# Patient Record
Sex: Female | Born: 1960 | Race: White | Hispanic: No | Smoking: Current every day smoker
Health system: Southern US, Community
[De-identification: ages and names within clinical notes are randomized; demographics above are authoritative.]

## PROBLEM LIST (undated history)

## (undated) DIAGNOSIS — F329 Major depressive disorder, single episode, unspecified: Secondary | ICD-10-CM

## (undated) DIAGNOSIS — F32A Depression, unspecified: Secondary | ICD-10-CM

## (undated) DIAGNOSIS — M797 Fibromyalgia: Secondary | ICD-10-CM

## (undated) DIAGNOSIS — R569 Unspecified convulsions: Secondary | ICD-10-CM

## (undated) DIAGNOSIS — F4310 Post-traumatic stress disorder, unspecified: Secondary | ICD-10-CM

## (undated) DIAGNOSIS — J449 Chronic obstructive pulmonary disease, unspecified: Secondary | ICD-10-CM

## (undated) DIAGNOSIS — F909 Attention-deficit hyperactivity disorder, unspecified type: Secondary | ICD-10-CM

## (undated) DIAGNOSIS — G4700 Insomnia, unspecified: Secondary | ICD-10-CM

## (undated) HISTORY — PX: ABDOMINAL HYSTERECTOMY: SHX81

## (undated) HISTORY — DX: Chronic obstructive pulmonary disease, unspecified: J44.9

---

## 2012-10-28 ENCOUNTER — Encounter (HOSPITAL_COMMUNITY): Payer: Self-pay | Admitting: *Deleted

## 2012-10-28 ENCOUNTER — Emergency Department (HOSPITAL_COMMUNITY)
Admission: EM | Admit: 2012-10-28 | Discharge: 2012-10-28 | Disposition: A | Discharge status: Home / Self Care | Payer: Self-pay | Attending: Emergency Medicine | Admitting: Emergency Medicine

## 2012-10-28 DIAGNOSIS — F17200 Nicotine dependence, unspecified, uncomplicated: Secondary | ICD-10-CM | POA: Insufficient documentation

## 2012-10-28 DIAGNOSIS — Z79899 Other long term (current) drug therapy: Secondary | ICD-10-CM | POA: Insufficient documentation

## 2012-10-28 DIAGNOSIS — F3289 Other specified depressive episodes: Secondary | ICD-10-CM | POA: Insufficient documentation

## 2012-10-28 DIAGNOSIS — F329 Major depressive disorder, single episode, unspecified: Secondary | ICD-10-CM | POA: Insufficient documentation

## 2012-10-28 DIAGNOSIS — F1910 Other psychoactive substance abuse, uncomplicated: Secondary | ICD-10-CM | POA: Insufficient documentation

## 2012-10-28 HISTORY — DX: Depression, unspecified: F32.A

## 2012-10-28 HISTORY — DX: Major depressive disorder, single episode, unspecified: F32.9

## 2012-10-28 LAB — RAPID URINE DRUG SCREEN, HOSP PERFORMED
Cocaine: NOT DETECTED
Opiates: POSITIVE — AB

## 2012-10-28 LAB — CBC WITH DIFFERENTIAL/PLATELET
Basophils Absolute: 0.1 10*3/uL (ref 0.0–0.1)
Basophils Relative: 1 % (ref 0–1)
Hemoglobin: 13 g/dL (ref 12.0–15.0)
MCHC: 33 g/dL (ref 30.0–36.0)
Monocytes Relative: 6 % (ref 3–12)
Neutro Abs: 5.8 10*3/uL (ref 1.7–7.7)
Neutrophils Relative %: 59 % (ref 43–77)
Platelets: 360 10*3/uL (ref 150–400)
RDW: 15.2 % (ref 11.5–15.5)

## 2012-10-28 LAB — ETHANOL: Alcohol, Ethyl (B): <11 mg/dL (ref 0–11)

## 2012-10-28 LAB — BASIC METABOLIC PANEL
BUN: 16 mg/dL (ref 6–23)
GFR calc Af Amer: >90 mL/min (ref >90)
GFR calc non Af Amer: 85 mL/min — ABNORMAL LOW (ref >90)
Potassium: 3.5 mEq/L (ref 3.5–5.1)

## 2012-10-28 NOTE — ED Notes (Signed)
Pt says depressed,  Health dept advised to come to ER due t o  SI.   Vomiting, yesterday, and cried all day.

## 2012-10-28 NOTE — ED Provider Notes (Signed)
History     CSN: [id number]  Arrival date & time 10/28/12  1731   First MD Initiated Contact with Patient 10/28/12 1824      Chief Complaint  Patient presents with  . V70.1    (Consider location/radiation/quality/duration/timing/severity/associated sxs/prior treatment) HPI.... patient has run out of her Ritalin and wants me to refill it.  No suicidal or homicidal ideation. Patient has history of depression. Nothing makes symptoms better or worse. Severity is mild to moderate. She has been crying today.  Past Medical History  Diagnosis Date  . Depression     Past Surgical History  Procedure Laterality Date  . Abdominal hysterectomy      History reviewed. No pertinent family history.  History  Substance Use Topics  . Smoking status: Current Every Day Smoker  . Smokeless tobacco: Not on file  . Alcohol Use: No    OB History   Grav Para Term Preterm Abortions TAB SAB Ect Mult Living                  Review of Systems  All other systems reviewed and are negative.    Allergies  Sulfa antibiotics  Home Medications   Current Outpatient Rx  Name  Route  Sig  Dispense  Refill  . diazepam (VALIUM) 10 MG tablet   Oral   Take 10 mg by mouth 3 (three) times daily.         Marland Kitchen FLUoxetine (PROZAC) 40 MG capsule   Oral   Take 40 mg by mouth daily.         . methylphenidate (RITALIN) 10 MG tablet   Oral   Take 20-30 mg by mouth 3 (three) times daily. Patient takes 3 tablets in the morning and 2 tablets at noon and 2 tablets at 5pm           BP 106/57  Pulse 85  Temp(Src) 98.5 F (36.9 C) (Oral)  Resp 20  Ht 5\' 1"  (1.549 m)  Wt 110 lb (49.896 kg)  BMI 20.8 kg/m2  SpO2 98%  Physical Exam  Nursing note and vitals reviewed. Constitutional: She is oriented to person, place, and time. She appears well-developed and well-nourished.  HENT:  Head: Normocephalic and atraumatic.  Eyes: Conjunctivae and EOM are normal. Pupils are equal, round, and reactive to  light.  Neck: Normal range of motion. Neck supple.  Cardiovascular: Normal rate, regular rhythm and normal heart sounds.   Pulmonary/Chest: Effort normal and breath sounds normal.  Abdominal: Soft. Bowel sounds are normal.  Musculoskeletal: Normal range of motion.  Neurological: She is alert and oriented to person, place, and time.  Skin: Skin is warm and dry.  Psychiatric:  Flight of ideas, but not psychotic    ED Course  Procedures (including critical care time)  Labs Reviewed  BASIC METABOLIC PANEL - Abnormal; Notable for the following:    GFR calc non Af Amer 85 (*)    All other components within normal limits  URINE RAPID DRUG SCREEN (HOSP PERFORMED) - Abnormal; Notable for the following:    Opiates POSITIVE (*)    Benzodiazepines POSITIVE (*)    Amphetamines POSITIVE (*)    Tetrahydrocannabinol POSITIVE (*)    All other components within normal limits  CBC WITH DIFFERENTIAL  ETHANOL   No results found.   1. Depression       MDM  Patient is not psychotic. No suicidal or homicidal ideation. Drug screen positive for opiates, benzos, amphetamines, THC.   Referral  to mental Health Center.        Donnetta Hutching, MD 10/28/12 2028

## 2012-10-28 NOTE — ED Notes (Signed)
Pt requesting to leave. EDP aware and discharge orders placed.

## 2012-10-28 NOTE — ED Notes (Signed)
Pt given personal belongings and jewelry by security

## 2012-10-28 NOTE — ED Notes (Signed)
Pt states she has been off of her medication for a while because she cannot afford them. States she did start taking Prozac yesterday. States she has lost her home and has family issues. States she does not have insurance so she can't find a doctor. Stated she was told by [facility] they could not help her.

## 2014-03-13 ENCOUNTER — Other Ambulatory Visit (HOSPITAL_COMMUNITY): Payer: Self-pay | Admitting: Physician Assistant

## 2014-03-13 DIAGNOSIS — Z1231 Encounter for screening mammogram for malignant neoplasm of breast: Secondary | ICD-10-CM

## 2014-03-16 ENCOUNTER — Ambulatory Visit (HOSPITAL_COMMUNITY): Admission: RE | Admit: 2014-03-16 | Payer: Self-pay | Source: Ambulatory Visit

## 2014-03-23 ENCOUNTER — Ambulatory Visit (HOSPITAL_COMMUNITY)
Admission: RE | Admit: 2014-03-23 | Discharge: 2014-03-23 | Disposition: A | Discharge status: Home / Self Care | Payer: Self-pay | Source: Ambulatory Visit | Attending: Physician Assistant | Admitting: Physician Assistant

## 2014-03-23 DIAGNOSIS — Z1231 Encounter for screening mammogram for malignant neoplasm of breast: Secondary | ICD-10-CM

## 2014-10-30 ENCOUNTER — Encounter (HOSPITAL_COMMUNITY): Payer: Self-pay | Admitting: Emergency Medicine

## 2014-10-30 ENCOUNTER — Emergency Department (HOSPITAL_COMMUNITY)
Admission: EM | Admit: 2014-10-30 | Discharge: 2014-10-30 | Disposition: A | Discharge status: Home / Self Care | Payer: Self-pay | Attending: Emergency Medicine | Admitting: Emergency Medicine

## 2014-10-30 ENCOUNTER — Emergency Department (HOSPITAL_COMMUNITY): Payer: Self-pay

## 2014-10-30 DIAGNOSIS — Z8739 Personal history of other diseases of the musculoskeletal system and connective tissue: Secondary | ICD-10-CM | POA: Insufficient documentation

## 2014-10-30 DIAGNOSIS — Z720 Tobacco use: Secondary | ICD-10-CM | POA: Insufficient documentation

## 2014-10-30 DIAGNOSIS — Z7982 Long term (current) use of aspirin: Secondary | ICD-10-CM | POA: Insufficient documentation

## 2014-10-30 DIAGNOSIS — F4310 Post-traumatic stress disorder, unspecified: Secondary | ICD-10-CM | POA: Insufficient documentation

## 2014-10-30 DIAGNOSIS — R519 Headache, unspecified: Secondary | ICD-10-CM

## 2014-10-30 DIAGNOSIS — R51 Headache: Secondary | ICD-10-CM | POA: Insufficient documentation

## 2014-10-30 DIAGNOSIS — F329 Major depressive disorder, single episode, unspecified: Secondary | ICD-10-CM | POA: Insufficient documentation

## 2014-10-30 DIAGNOSIS — F419 Anxiety disorder, unspecified: Secondary | ICD-10-CM | POA: Insufficient documentation

## 2014-10-30 DIAGNOSIS — Z8669 Personal history of other diseases of the nervous system and sense organs: Secondary | ICD-10-CM | POA: Insufficient documentation

## 2014-10-30 HISTORY — DX: Unspecified convulsions: R56.9

## 2014-10-30 HISTORY — DX: Fibromyalgia: M79.7

## 2014-10-30 HISTORY — DX: Attention-deficit hyperactivity disorder, unspecified type: F90.9

## 2014-10-30 HISTORY — DX: Post-traumatic stress disorder, unspecified: F43.10

## 2014-10-30 HISTORY — DX: Insomnia, unspecified: G47.00

## 2014-10-30 LAB — URINE MICROSCOPIC-ADD ON

## 2014-10-30 LAB — CBC WITH DIFFERENTIAL/PLATELET
BASOS ABS: 0.1 10*3/uL (ref 0.0–0.1)
BASOS PCT: 1 % (ref 0–1)
EOS PCT: 3 % (ref 0–5)
Eosinophils Absolute: 0.3 10*3/uL (ref 0.0–0.7)
HCT: 47 % — ABNORMAL HIGH (ref 36.0–46.0)
Hemoglobin: 15.3 g/dL — ABNORMAL HIGH (ref 12.0–15.0)
LYMPHS ABS: 3.3 10*3/uL (ref 0.7–4.0)
Lymphocytes Relative: 31 % (ref 12–46)
MCH: 31.7 pg (ref 26.0–34.0)
MCHC: 32.6 g/dL (ref 30.0–36.0)
MCV: 97.3 fL (ref 78.0–100.0)
Monocytes Absolute: 0.6 10*3/uL (ref 0.1–1.0)
Monocytes Relative: 6 % (ref 3–12)
NEUTROS PCT: 59 % (ref 43–77)
Neutro Abs: 6.5 10*3/uL (ref 1.7–7.7)
PLATELETS: 336 10*3/uL (ref 150–400)
RBC: 4.83 MIL/uL (ref 3.87–5.11)
RDW: 16.3 % — AB (ref 11.5–15.5)
WBC: 10.8 10*3/uL — ABNORMAL HIGH (ref 4.0–10.5)

## 2014-10-30 LAB — COMPREHENSIVE METABOLIC PANEL
ALBUMIN: 3.9 g/dL (ref 3.5–5.2)
ALK PHOS: 130 U/L — AB (ref 39–117)
ALT: 8 U/L (ref 0–35)
AST: 16 U/L (ref 0–37)
Anion gap: 9 (ref 5–15)
BILIRUBIN TOTAL: 0.4 mg/dL (ref 0.3–1.2)
BUN: 11 mg/dL (ref 6–23)
CALCIUM: 8.9 mg/dL (ref 8.4–10.5)
CHLORIDE: 109 mmol/L (ref 96–112)
CO2: 26 mmol/L (ref 19–32)
CREATININE: 0.66 mg/dL (ref 0.50–1.10)
GFR calc Af Amer: >90 mL/min (ref >90)
Glucose, Bld: 71 mg/dL (ref 70–99)
Potassium: 4.4 mmol/L (ref 3.5–5.1)
Sodium: 144 mmol/L (ref 135–145)
TOTAL PROTEIN: 7 g/dL (ref 6.0–8.3)

## 2014-10-30 LAB — URINALYSIS, ROUTINE W REFLEX MICROSCOPIC
Bilirubin Urine: NEGATIVE
Glucose, UA: NEGATIVE mg/dL
KETONES UR: NEGATIVE mg/dL
NITRITE: NEGATIVE
PH: 6.5 (ref 5.0–8.0)
PROTEIN: NEGATIVE mg/dL
Specific Gravity, Urine: <1.005 — ABNORMAL LOW (ref 1.005–1.030)
Urobilinogen, UA: 0.2 mg/dL (ref 0.0–1.0)

## 2014-10-30 MED ORDER — IBUPROFEN 800 MG PO TABS: 800.0000 mg | ORAL_TABLET | Freq: Three times a day (TID) | ORAL | Status: DC | PRN

## 2014-10-30 MED ORDER — IBUPROFEN 800 MG PO TABS
800.0000 mg | ORAL_TABLET | Freq: Once | ORAL | Status: AC
  Administered 2014-10-30: 800 mg via ORAL
  Filled 2014-10-30: qty 1

## 2014-10-30 NOTE — Discharge Instructions (Signed)
Follow up with a family md next week °

## 2014-10-30 NOTE — ED Provider Notes (Signed)
CSN: [id number]     Arrival date & time 10/30/14  1356 History   First MD Initiated Contact with Patient 10/30/14 1635     Chief Complaint  Patient presents with  . Multiple complaints      (Consider location/radiation/quality/duration/timing/severity/associated sxs/prior Treatment) Patient is a 54 y.o. female presenting with headaches. The history is provided by the patient (the pt complains of a headache).  Headache Pain location:  Frontal Quality:  Dull Radiates to:  Does not radiate Severity currently:  3/10 Severity at highest:  8/10 Onset quality:  Gradual Timing:  Intermittent Chronicity:  Recurrent Context: not activity   Associated symptoms: no abdominal pain, no back pain, no congestion, no cough, no diarrhea, no fatigue, no seizures and no sinus pressure     Past Medical History  Diagnosis Date  . Depression   . Fibromyalgia   . Seizures   . ADHD (attention deficit hyperactivity disorder)   . Insomnia   . PTSD (post-traumatic stress disorder)    Past Surgical History  Procedure Laterality Date  . Abdominal hysterectomy     History reviewed. No pertinent family history. History  Substance Use Topics  . Smoking status: Current Every Day Smoker -- 1.50 packs/day    Types: Cigarettes  . Smokeless tobacco: Never Used  . Alcohol Use: No   OB History    Gravida Para Term Preterm AB TAB SAB Ectopic Multiple Living   Review of Systems  Constitutional: Negative for appetite change and fatigue.  HENT: Negative for congestion, ear discharge and sinus pressure.   Eyes: Negative for discharge.  Respiratory: Negative for cough.   Cardiovascular: Negative for chest pain.  Gastrointestinal: Negative for abdominal pain and diarrhea.  Genitourinary: Negative for frequency and hematuria.  Musculoskeletal: Negative for back pain.  Skin: Negative for rash.  Neurological: Positive for headaches. Negative for seizures.  Psychiatric/Behavioral: Negative  for hallucinations.      Allergies  Sulfa antibiotics  Home Medications   Prior to Admission medications   Medication Sig Start Date End Date Taking? Authorizing Provider  Aspirin-Salicylamide-Caffeine 325-95-16 MG TABS Take 1-2 packets by mouth daily as needed (FOR PAIN).   Yes Historical Provider, MD  PARoxetine (PAXIL) 30 MG tablet Take 30 mg by mouth every morning. 10/27/14  Yes Historical Provider, MD  ibuprofen (ADVIL,MOTRIN) 800 MG tablet Take 1 tablet (800 mg total) by mouth every 8 (eight) hours as needed for headache. 10/30/14   Bethann Berkshire, MD   BP 94/44 mmHg  Pulse 75  Temp(Src) 98.1 F (36.7 C) (Oral)  Resp 19  Ht  (1.549 m)  Wt 108 lb (48.988 kg)  BMI 20.42 kg/m2  SpO2 94% Physical Exam  Constitutional: She is oriented to person, place, and time. She appears well-developed.  HENT:  Head: Normocephalic.  Eyes: Conjunctivae and EOM are normal. No scleral icterus.  Neck: Neck supple. No thyromegaly present.  Cardiovascular: Normal rate and regular rhythm.  Exam reveals no gallop and no friction rub.   No murmur heard. Pulmonary/Chest: No stridor. She has no wheezes. She has no rales. She exhibits no tenderness.  Abdominal: She exhibits no distension. There is no tenderness. There is no rebound.  Musculoskeletal: Normal range of motion. She exhibits no edema.  Lymphadenopathy:    She has no cervical adenopathy.  Neurological: She is oriented to person, place, and time. She exhibits normal muscle tone. Coordination normal.  Skin: No rash  noted. No erythema.  Psychiatric:  Pt moderately anxious    ED Course  Procedures (including critical care time) Labs Review Labs Reviewed  CBC WITH DIFFERENTIAL/PLATELET - Abnormal; Notable for the following:    WBC 10.8 (*)    Hemoglobin 15.3 (*)    HCT 47.0 (*)    RDW 16.3 (*)    All other components within normal limits  COMPREHENSIVE METABOLIC PANEL - Abnormal; Notable for the following:    Alkaline  Phosphatase 130 (*)    All other components within normal limits  URINALYSIS, ROUTINE W REFLEX MICROSCOPIC - Abnormal; Notable for the following:    Specific Gravity, Urine <1.005 (*)    Hgb urine dipstick TRACE (*)    Leukocytes, UA SMALL (*)    All other components within normal limits  URINE MICROSCOPIC-ADD ON - Abnormal; Notable for the following:    Squamous Epithelial / LPF FEW (*)    Bacteria, UA FEW (*)    All other components within normal limits    Imaging Review Mr Brain Wo Contrast  10/30/2014   CLINICAL DATA:  Headache an weakness.  One year duration.  EXAM: MRI HEAD WITHOUT CONTRAST  TECHNIQUE: Multiplanar, multiecho pulse sequences of the brain and surrounding structures were obtained without intravenous contrast.  COMPARISON:  None.  FINDINGS: Diffusion imaging does not show any acute or subacute infarction. There are mild small vessel changes of the pons and cerebellum. The cerebral hemispheres show scattered small foci of T2 and FLAIR signal consistent with mild small vessel change. Migraine related foci could have this appearance. No cortical or large vessel territory insult. No mass lesion, hemorrhage, hydrocephalus or extra-axial collection. No pituitary mass. No inflammatory sinus disease. No skull or skullbase lesion. There is a small amount of fluid in the mastoid air cells on the right.  IMPRESSION: No acute brain finding. Minor small vessel changes affecting the brain as outlined above. The differential diagnosis does include migraine related foci, given this history.  Small amount of fluid in the mastoid air cells on the right.   Electronically Signed   By: Paulina FusiMark  Shogry M.D.   On: 10/30/2014 17:33     EKG Interpretation None      MDM   Final diagnoses:  Headache behind the eyes  Anxiety    Headache,  Nl studies,  tx with motrin    Bethann BerkshireJoseph Ithan Touhey, MD 10/30/14 [phone]

## 2014-10-30 NOTE — ED Notes (Signed)
Pt reports she has episodes of weakness and uncoordinated gait. Reports a fall several days ago. Pt states she has been taken off her medications. Pt reports uncontrolled pain. [facility]. Pt reports [facility] has not been [facility].

## 2015-06-28 ENCOUNTER — Ambulatory Visit: Payer: Self-pay | Admitting: Physician Assistant

## 2015-06-28 ENCOUNTER — Encounter: Payer: Self-pay | Admitting: Physician Assistant

## 2015-06-28 VITALS — BP 120/86 | HR 79 | Temp 97.2°F | Wt 116.4 lb

## 2015-06-28 DIAGNOSIS — Z1239 Encounter for other screening for malignant neoplasm of breast: Secondary | ICD-10-CM

## 2015-06-28 DIAGNOSIS — F17218 Nicotine dependence, cigarettes, with other nicotine-induced disorders: Secondary | ICD-10-CM

## 2015-06-28 NOTE — Patient Instructions (Signed)
Smoking Cessation, Tips for Success If you are ready to quit smoking, congratulations! You have chosen to help yourself be healthier. Cigarettes bring nicotine, tar, carbon monoxide, and other irritants into your body. Your lungs, heart, and blood vessels will be able to work better without these poisons. There are many different ways to quit smoking. Nicotine gum, nicotine patches, a nicotine inhaler, or nicotine nasal spray can help with physical craving. Hypnosis, support groups, and medicines help break the habit of smoking. WHAT THINGS CAN I DO TO MAKE QUITTING EASIER?  Here are some tips to help you quit for good:  Pick a date when you will quit smoking completely. Tell all of your friends and family about your plan to quit on that date.  Do not try to slowly cut down on the number of cigarettes you are smoking. Pick a quit date and quit smoking completely starting on that day.  Throw away all cigarettes.   Clean and remove all ashtrays from your home, work, and car.  On a card, write down your reasons for quitting. Carry the card with you and read it when you get the urge to smoke.  Cleanse your body of nicotine. Drink enough water and fluids to keep your urine clear or pale yellow. Do this after quitting to flush the nicotine from your body.  Learn to predict your moods. Do not let a bad situation be your excuse to have a cigarette. Some situations in your life might tempt you into wanting a cigarette.  Never have "just one" cigarette. It leads to wanting another and another. Remind yourself of your decision to quit.  Change habits associated with smoking. If you smoked while driving or when feeling stressed, try other activities to replace smoking. Stand up when drinking your coffee. Brush your teeth after eating. Sit in a different chair when you read the paper. Avoid alcohol while trying to quit, and try to drink fewer caffeinated beverages. Alcohol and caffeine may urge you to  smoke.  Avoid foods and drinks that can trigger a desire to smoke, such as sugary or spicy foods and alcohol.  Ask people who smoke not to smoke around you.  Have something planned to do right after eating or having a cup of coffee. For example, plan to take a walk or exercise.  Try a relaxation exercise to calm you down and decrease your stress. Remember, you may be tense and nervous for the first 2 weeks after you quit, but this will pass.  Find new activities to keep your hands busy. Play with a pen, coin, or rubber band. Doodle or draw things on paper.  Brush your teeth right after eating. This will help cut down on the craving for the taste of tobacco after meals. You can also try mouthwash.   Use oral substitutes in place of cigarettes. Try using lemon drops, carrots, cinnamon sticks, or chewing gum. Keep them handy so they are available when you have the urge to smoke.  When you have the urge to smoke, try deep breathing.  Designate your home as a nonsmoking area.  If you are a heavy smoker, ask your health care provider about a prescription for nicotine chewing gum. It can ease your withdrawal from nicotine.  Reward yourself. Set aside the cigarette money you save and buy yourself something nice.  Look for support from others. Join a support group or smoking cessation program. Ask someone at home or at work to help you with your plan   to quit smoking.  Always ask yourself, "Do I need this cigarette or is this just a reflex?" Tell yourself, "Today, I choose not to smoke," or "I do not want to smoke." You are reminding yourself of your decision to quit.  Do not replace cigarette smoking with electronic cigarettes (commonly called e-cigarettes). The safety of e-cigarettes is unknown, and some may contain harmful chemicals.  If you relapse, do not give up! Plan ahead and think about what you will do the next time you get the urge to smoke. HOW WILL I FEEL WHEN I QUIT SMOKING? You  may have symptoms of withdrawal because your body is used to nicotine (the addictive substance in cigarettes). You may crave cigarettes, be irritable, feel very hungry, cough often, get headaches, or have difficulty concentrating. The withdrawal symptoms are only temporary. They are strongest when you first quit but will go away within 10-14 days. When withdrawal symptoms occur, stay in control. Think about your reasons for quitting. Remind yourself that these are signs that your body is healing and getting used to being without cigarettes. Remember that withdrawal symptoms are easier to treat than the major diseases that smoking can cause.  Even after the withdrawal is over, expect periodic urges to smoke. However, these cravings are generally short lived and will go away whether you smoke or not. Do not smoke! WHAT RESOURCES ARE AVAILABLE TO HELP ME QUIT SMOKING? Your health care provider can direct you to community resources or hospitals for support, which may include:  Group support.  Education.  Hypnosis.  Therapy.   This information is not intended to replace advice given to you by your health care provider. Make sure you discuss any questions you have with your health care provider.   Document Released: 03/28/2004 Document Revised: 07/21/2014 Document Reviewed: 12/16/2012 Elsevier Interactive Patient Education 2016 [employer]  

## 2015-06-28 NOTE — Progress Notes (Signed)
BP 120/86 mmHg  Pulse 79  Temp(Src) 97.2 F (36.2 C)  Wt 116 lb 6.4 oz (52.799 kg)  SpO2 96%   Subjective:    Patient ID: Donna Jordan, female    DOB: 11/19/1960, 54 y.o.   MRN: [id number]  HPI: Donna Jordan is a 54 y.o. female presenting on 06/28/2015 for Nicotine Dependence   HPI   Pt says she was dx with copd in sept at [facility].  Says she was given albuterol mdi and oxygen.  Pt is continuing to smoke.  Pt is still going to [facility] services.  Relevant past medical, surgical, family and social history reviewed and updated as indicated. Interim medical history since our last visit reviewed. Allergies and medications reviewed and updated.   Current outpatient prescriptions:  .  albuterol (PROVENTIL HFA;VENTOLIN HFA) 108 (90 BASE) MCG/ACT inhaler, Inhale into the lungs every 6 (six) hours as needed for wheezing or shortness of breath., Disp: , Rfl:  .  Aspirin-Salicylamide-Caffeine 325-95-16 MG TABS, Take 1-2 packets by mouth daily as needed (FOR PAIN)., Disp: , Rfl:  .  busPIRone (BUSPAR) 15 MG tablet, Take 15 mg by mouth 2 (two) times daily. 15 mg qAM and 30 mg qhs, Disp: , Rfl:  .  FLUoxetine (PROZAC) 40 MG capsule, Take 40 mg by mouth daily., Disp: , Rfl:  .  gabapentin (NEURONTIN) 300 MG capsule, Take 300 mg by mouth 2 (two) times daily., Disp: , Rfl:    Review of Systems  Constitutional: Positive for fatigue. Negative for fever, chills, diaphoresis, appetite change and unexpected weight change.  HENT: Positive for congestion, ear pain, hearing loss and sneezing. Negative for dental problem, drooling, facial swelling, mouth sores, sore throat, trouble swallowing and voice change.   Eyes: Negative for pain, discharge, redness, itching and visual disturbance.  Respiratory: Positive for shortness of breath and wheezing. Negative for cough and choking.   Cardiovascular: Negative for chest pain, palpitations and leg swelling.  Gastrointestinal: Negative for vomiting,  abdominal pain, diarrhea, constipation and blood in stool.  Endocrine: Negative for cold intolerance, heat intolerance and polydipsia.  Genitourinary: Negative for dysuria, hematuria and decreased urine volume.  Musculoskeletal: Positive for back pain and arthralgias. Negative for gait problem.  Skin: Negative for rash.  Allergic/Immunologic: Positive for environmental allergies.  Neurological: Positive for headaches. Negative for seizures, syncope and light-headedness.  Hematological: Negative for adenopathy.  Psychiatric/Behavioral: Positive for dysphoric mood and agitation. Negative for suicidal ideas. The patient is nervous/anxious.     Per HPI unless specifically indicated above     Objective:    BP 120/86 mmHg  Pulse 79  Temp(Src) 97.2 F (36.2 C)  Wt 116 lb 6.4 oz (52.799 kg)  SpO2 96%  Wt Readings from Last 3 Encounters:  06/28/15 116 lb 6.4 oz (52.799 kg)  10/30/14 108 lb (48.988 kg)  10/28/12 110 lb (49.896 kg)    Physical Exam  Constitutional: She is oriented to person, place, and time. She appears well-developed and well-nourished.  HENT:  Head: Normocephalic and atraumatic.  Neck: Neck supple.  Cardiovascular: Normal rate and regular rhythm.   Pulmonary/Chest: Effort normal and breath sounds normal.  Abdominal: Soft. Bowel sounds are normal. She exhibits no mass. There is no tenderness.  Musculoskeletal: She exhibits no edema.  Lymphadenopathy:    She has no cervical adenopathy.  Neurological: She is alert and oriented to person, place, and time.  Skin: Skin is warm and dry.  Psychiatric: She has a normal mood  and affect. Her behavior is normal.  Vitals reviewed.       Assessment & Plan:   Encounter Diagnoses  Name Primary?  . Nicotine dependence, cigarettes, with other nicotine-induced disorders Yes  . Screening for breast cancer     -order screening mammo -counseled on smoking cessation -f/u 6 mo. rto sooner prn

## 2015-12-03 ENCOUNTER — Other Ambulatory Visit: Payer: Self-pay | Admitting: Physician Assistant

## 2015-12-03 DIAGNOSIS — Z1239 Encounter for other screening for malignant neoplasm of breast: Secondary | ICD-10-CM

## 2015-12-24 ENCOUNTER — Encounter (HOSPITAL_COMMUNITY): Payer: Self-pay

## 2015-12-27 ENCOUNTER — Ambulatory Visit: Payer: Self-pay | Admitting: Physician Assistant

## 2015-12-31 ENCOUNTER — Encounter (HOSPITAL_COMMUNITY): Payer: Self-pay

## 2016-01-17 ENCOUNTER — Encounter: Payer: Self-pay | Admitting: Physician Assistant

## 2016-02-14 IMAGING — MR MR HEAD W/O CM
8 of 10 series · 32 of 48 positions shown · non-contrast
Comparison: None.

CLINICAL DATA: Headache an weakness.  One year duration.

EXAM:
MRI HEAD WITHOUT CONTRAST
TECHNIQUE: Multiplanar, multiecho pulse sequences of the brain and surrounding
structures were obtained without intravenous contrast.

[Series 2: [id number]_sag · sagittal · 5.0mm · 0.45mm/px · 3 of 20 slices shown]
[im 1/20]
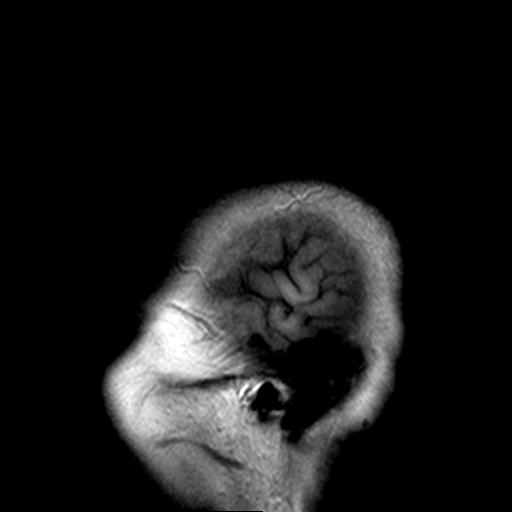
[im 10/20]
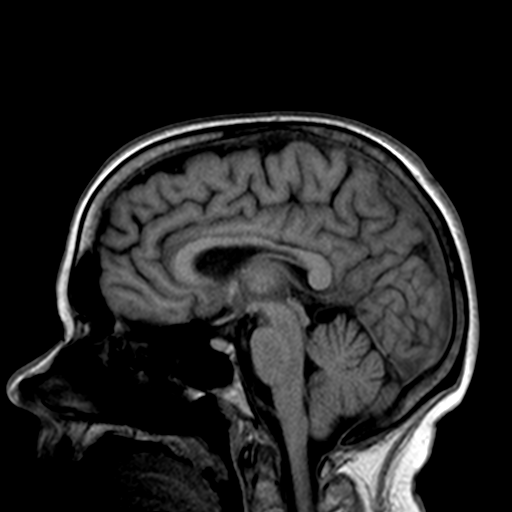
[im 20/20]
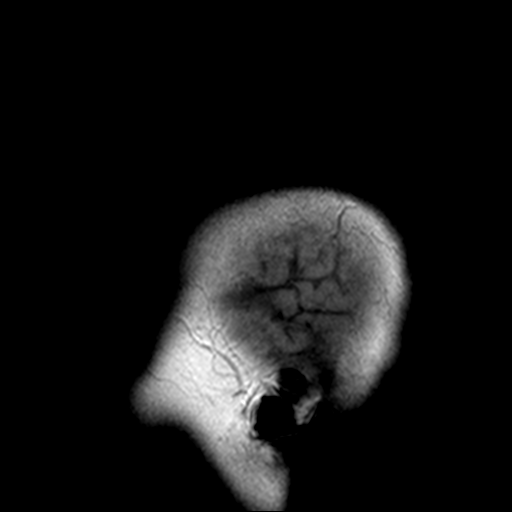

[Series 5: T2 · axial · 5.0mm · 0.75mm/px · z∈[-119,+24]mm · 4 of 23 slices shown (1 of 2)]
[im 1/23]
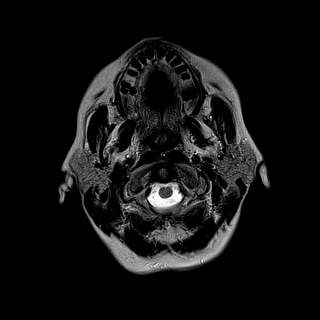
[im 8/23]
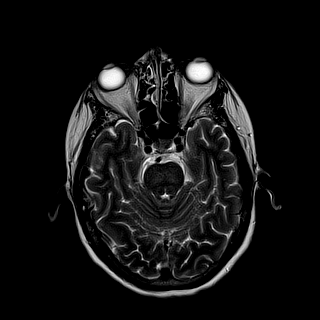
[im 15/23]
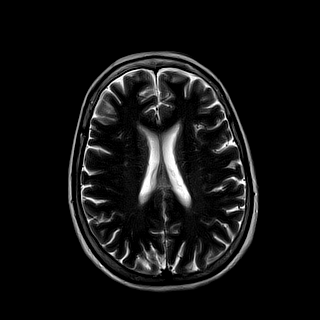
[im 23/23]
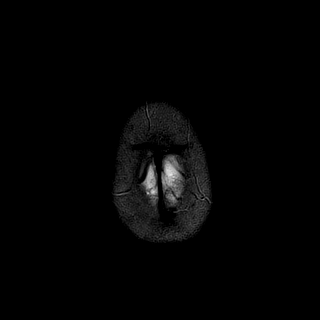

[Series 6: FLAIR · axial · 5.0mm · 0.94mm/px · z∈[-119,+24]mm · 3 of 23 slices shown]
[im 1/23]
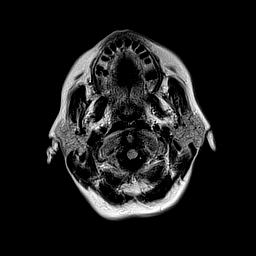
[im 12/23]
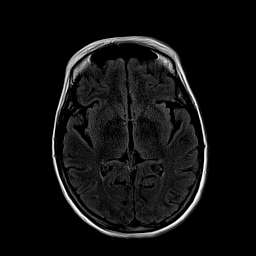
[im 23/23]
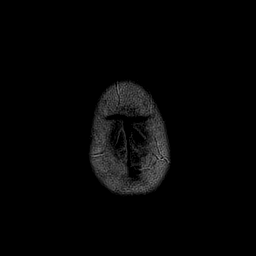

[Series 7: T1 · axial · 2.0mm · 0.47mm/px · z∈[-133,+55]mm · 11 of 95 slices shown]
[im 1/95]
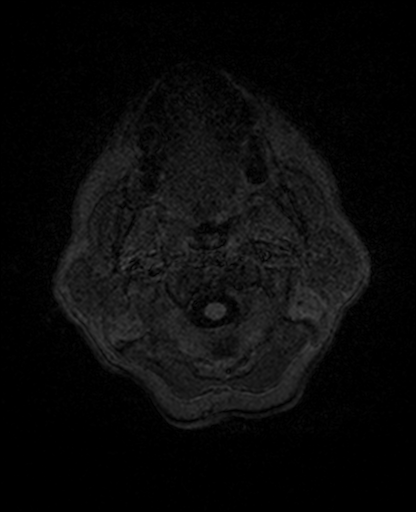
[im 10/95]
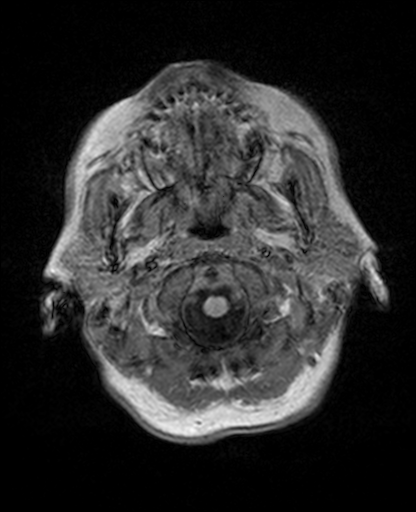
[im 19/95]
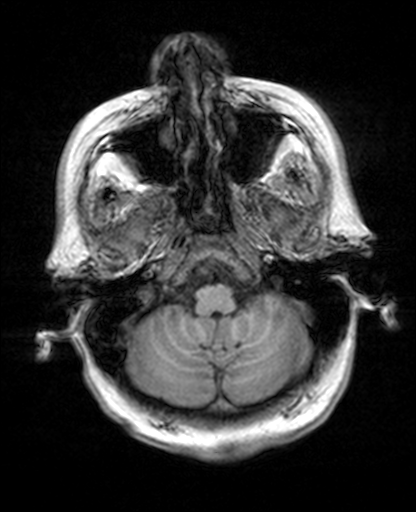
[im 29/95]
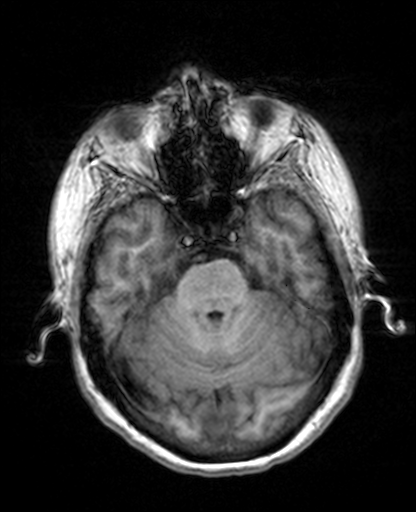
[im 38/95]
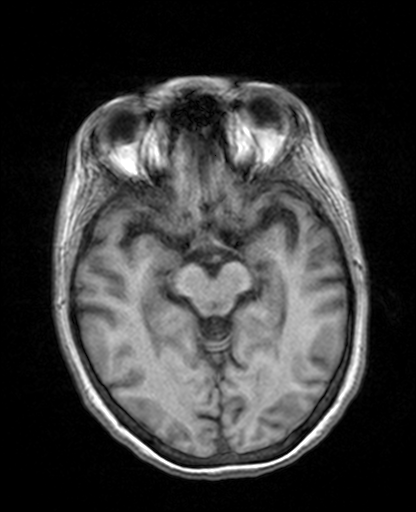
[im 48/95]
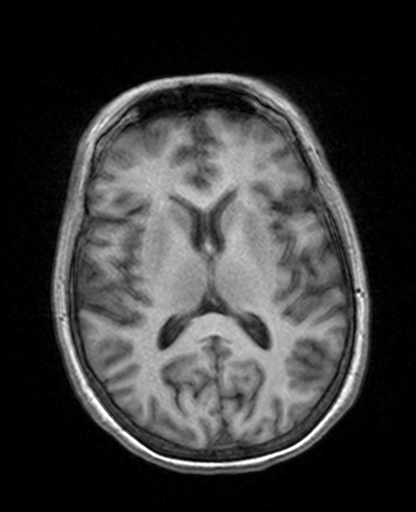
[im 57/95]
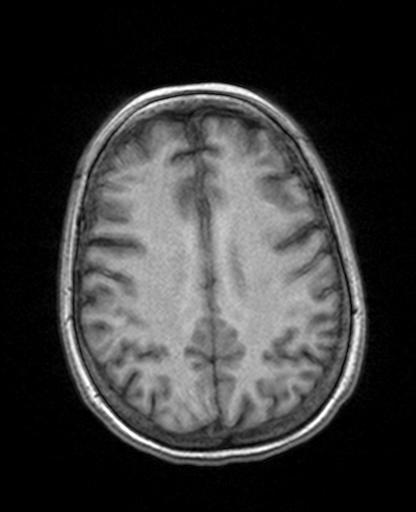
[im 66/95]
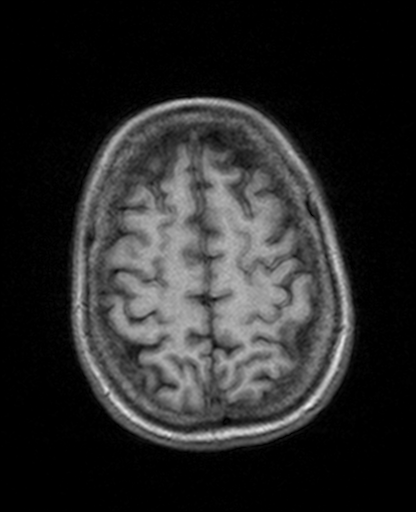
[im 76/95]
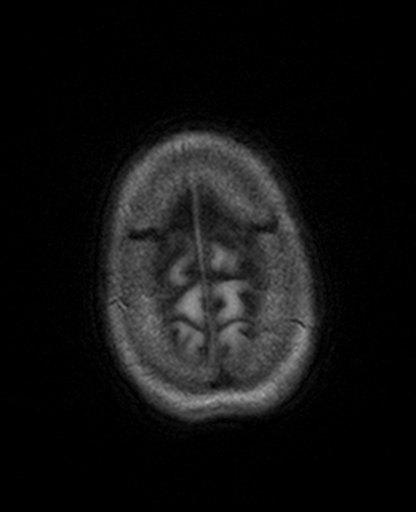
[im 85/95]
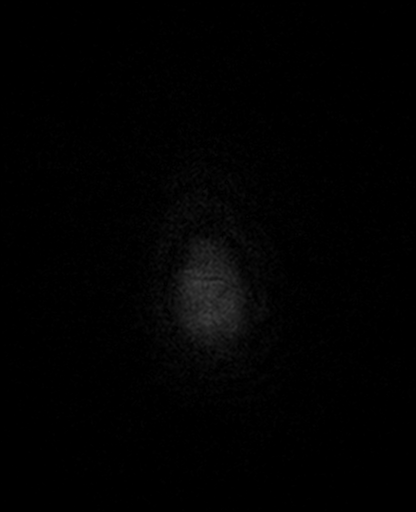
[im 95/95]
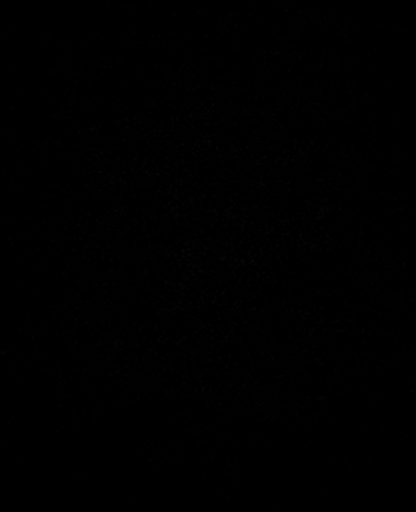

[Series 8: trauma axial · axial · 5.0mm · 0.45mm/px · z∈[-113,+17]mm · 2 of 21 slices shown]
[im 1/21]
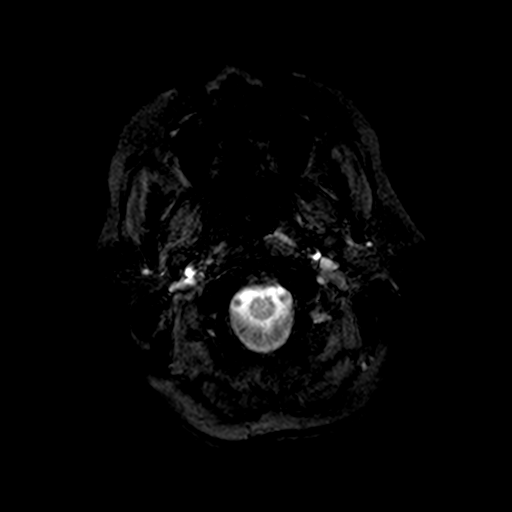
[im 21/21]
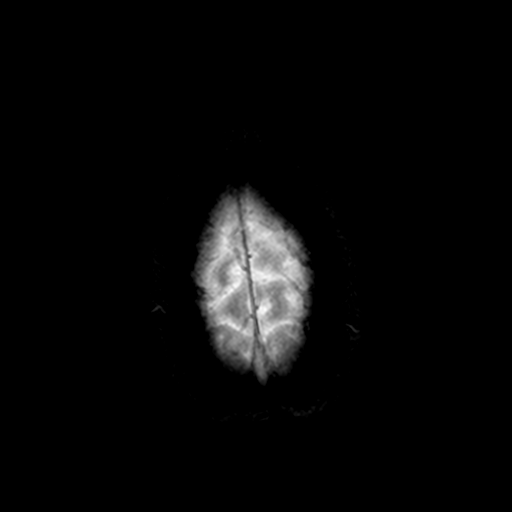

[Series 9: T2 · coronal · 5.0mm · 0.60mm/px · 3 of 28 slices shown (2 of 2)]
[im 1/28]
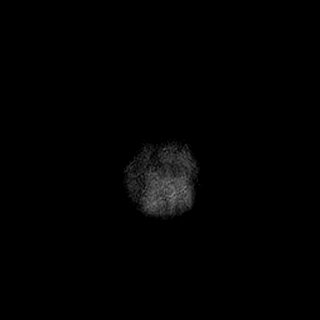
[im 14/28]
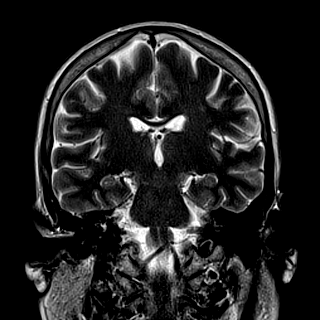
[im 28/28]
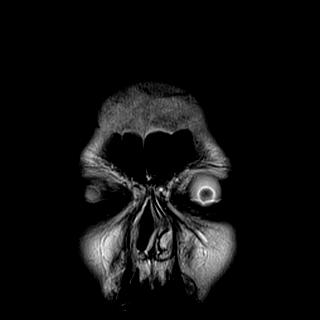

[Series 100: <mpr thick range> · axial · 3.0mm · 0.82mm/px · z∈[-115,+20]mm · 5 of 46 slices shown]
[im 1/46]
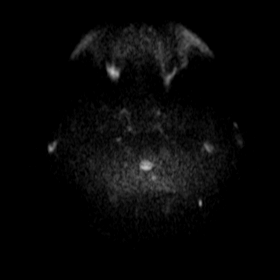
[im 12/46]
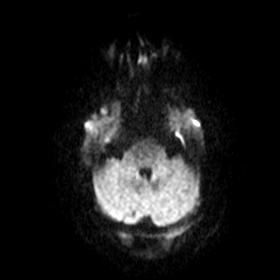
[im 23/46]
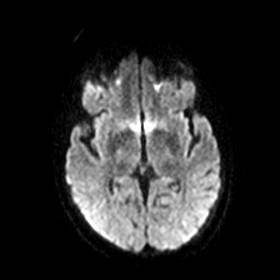
[im 34/46]
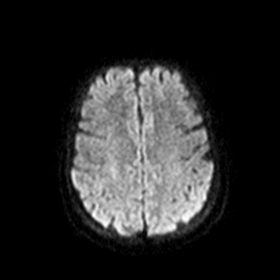
[im 46/46]
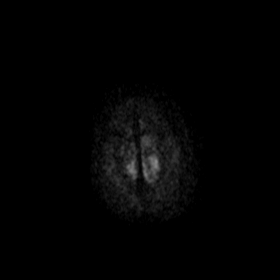

[Series 101: <mpr thick range(1)> · coronal · 3.0mm · 0.82mm/px · 1 of 53 slices shown]
[im 1/53]
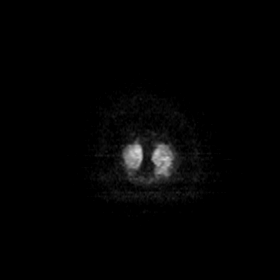

[32 of 48 positions shown; findings below may reference images not displayed]

FINDINGS: Diffusion imaging does not show any acute or subacute infarction.
There are mild small vessel changes of the pons and cerebellum. The
cerebral hemispheres show scattered small foci of T2 and FLAIR
signal consistent with mild small vessel change. Migraine related
foci could have this appearance. No cortical or large vessel
territory insult. No mass lesion, hemorrhage, hydrocephalus or
extra-axial collection. No pituitary mass. No inflammatory sinus
disease. No skull or skullbase lesion. There is a small amount of
fluid in the mastoid air cells on the right.
IMPRESSION: No acute brain finding. Minor small vessel changes affecting the
brain as outlined above. The differential diagnosis does include
migraine related foci, given this history.

Small amount of fluid in the mastoid air cells on the right.

## 2017-04-22 ENCOUNTER — Telehealth (HOSPITAL_COMMUNITY): Payer: Self-pay | Admitting: *Deleted

## 2017-04-22 NOTE — Telephone Encounter (Signed)
phone call to patient at [phone], provided by referring provider office.   line busy.

## 2017-04-28 ENCOUNTER — Telehealth (HOSPITAL_COMMUNITY): Payer: Self-pay | Admitting: *Deleted

## 2017-04-28 NOTE — Telephone Encounter (Signed)
left voice message regarding an appointment. 

## 2017-05-27 NOTE — Progress Notes (Deleted)
Psychiatric Initial Adult Assessment   Patient Identification: Donna Jordan MRN:  [id number] Date of Evaluation:  05/27/2017 Referral Source: *** Chief Complaint:   Visit Diagnosis: No diagnosis found.  History of Present Illness:   Donna Jordan is a 56 year old female with depression, ADHD, PTSD per chart, fibromyalgia, COPD, who is referred for   Associated Signs/Symptoms: Depression Symptoms:  {DEPRESSION SYMPTOMS:20000} (Hypo) Manic Symptoms:  {BHH MANIC SYMPTOMS:22872} Anxiety Symptoms:  {BHH ANXIETY SYMPTOMS:22873} Psychotic Symptoms:  {BHH PSYCHOTIC SYMPTOMS:22874} PTSD Symptoms: {BHH PTSD SYMPTOMS:22875}  Past Psychiatric History:  Outpatient:  Psychiatry admission:  Previous suicide attempt:  Past trials of medication:  History of violence:   Previous Psychotropic Medications: {YES/NO:21197}  Substance Abuse History in the last 12 months:  {yes no:314532}  Consequences of Substance Abuse: {BHH CONSEQUENCES OF SUBSTANCE ABUSE:22880}  Past Medical History:  Past Medical History:  Diagnosis Date  . ADHD (attention deficit hyperactivity disorder)   . COPD (chronic obstructive pulmonary disease) (HCC)   . Depression   . Fibromyalgia   . Insomnia   . PTSD (post-traumatic stress disorder)   . Seizures (HCC)     Past Surgical History:  Procedure Laterality Date  . ABDOMINAL HYSTERECTOMY      Family Psychiatric History: ***  Family History:  Family History  Problem Relation Age of Onset  . Cancer Father        lung    Social History:   Social History   Socioeconomic History  . Marital status: Divorced    Spouse name: Not on file  . Number of children: Not on file  . Years of education: Not on file  . Highest education level: Not on file  Social Needs  . Financial resource strain: Not on file  . Food insecurity - worry: Not on file  . Food insecurity - inability: Not on file  . Transportation needs - medical: Not on file  . Transportation needs  - non-medical: Not on file  Occupational History  . Not on file  Tobacco Use  . Smoking status: Current Every Day Smoker    Packs/day: 1.50    Years: 37.00    Pack years: 55.50    Types: Cigarettes  . Smokeless tobacco: Never Used  Substance and Sexual Activity  . Alcohol use: No  . Drug use: Yes    Types: Marijuana    Comment: Pt taking others's Percocet  . Sexual activity: Yes    Birth control/protection: Surgical  Other Topics Concern  . Not on file  Social History Narrative  . Not on file    Additional Social History: ***  Allergies:   Allergies  Allergen Reactions  . Codeine Itching  . Penicillins Swelling  . Sulfa Antibiotics Nausea And Vomiting    Metabolic Disorder Labs: No results found for: HGBA1C, MPG No results found for: PROLACTIN No results found for: CHOL, TRIG, HDL, CHOLHDL, VLDL, LDLCALC   Current Medications: Current Outpatient Medications  Medication Sig Dispense Refill  . albuterol (PROVENTIL HFA;VENTOLIN HFA) 108 (90 BASE) MCG/ACT inhaler Inhale into the lungs every 6 (six) hours as needed for wheezing or shortness of breath.    . Aspirin-Salicylamide-Caffeine 325-95-16 MG TABS Take 1-2 packets by mouth daily as needed (FOR PAIN).    Donna Jordan. busPIRone (BUSPAR) 15 MG tablet Take 15 mg by mouth 2 (two) times daily. 15 mg qAM and 30 mg qhs    . FLUoxetine (PROZAC) 40 MG capsule Take 40 mg by mouth daily.    .Donna Jordan  gabapentin (NEURONTIN) 300 MG capsule Take 300 mg by mouth 2 (two) times daily.     No current facility-administered medications for this visit.     Neurologic: Headache: No Seizure: No Paresthesias:No  Musculoskeletal: Strength & Muscle Tone: within normal limits Gait & Station: normal Patient leans: N/A  Psychiatric Specialty Exam: ROS  There were no vitals taken for this visit.There is no height or weight on file to calculate BMI.  General Appearance: Fairly Groomed  Eye Contact:  Good  Speech:  Clear and Coherent  Volume:  Normal   Mood:  {BHH MOOD:22306}  Affect:  {Affect (PAA):22687}  Thought Process:  Coherent and Goal Directed  Orientation:  Full (Time, Place, and Person)  Thought Content:  Logical  Suicidal Thoughts:  {ST/HT (PAA):22692}  Homicidal Thoughts:  {ST/HT (PAA):22692}  Memory:  Immediate;   Good Recent;   Good Remote;   Good  Judgement:  {Judgement (PAA):22694}  Insight:  {Insight (PAA):22695}  Psychomotor Activity:  Normal  Concentration:  Concentration: Good and Attention Span: Good  Recall:  Good  Fund of Knowledge:Good  Language: Good  Akathisia:  No  Handed:  Right  AIMS (if indicated):  N/A  Assets:  Communication Skills Desire for Improvement  ADL's:  Intact  Cognition: WNL  Sleep:  ***   Assessment  Plan  The patient demonstrates the following risk factors for suicide: Chronic risk factors for suicide include: {Chronic Risk Factors for AOZHYQM:57846962}Suicide:30414011}. Acute risk factors for suicide include: {Acute Risk Factors for XBMWUXL:24401027}Suicide:30414012}. Protective factors for this patient include: {Protective Factors for Suicide OZDG:64403474}Risk:30414013}. Considering these factors, the overall suicide risk at this point appears to be {Desc; low/moderate/high:110033}. Patient {ACTION; IS/IS QVZ:56387564}OT:21021397} appropriate for outpatient follow up.   Treatment Plan Summary: Plan as above   Neysa Hottereina Laurren Lepkowski, MD 11/14/20189:10 AM

## 2017-06-01 ENCOUNTER — Ambulatory Visit (HOSPITAL_COMMUNITY): Payer: Self-pay | Admitting: Psychiatry

## 2017-06-11 ENCOUNTER — Telehealth (HOSPITAL_COMMUNITY): Payer: Self-pay | Admitting: *Deleted

## 2017-06-11 NOTE — Telephone Encounter (Signed)
phone call, sister Darl PikesSusan answered, said she is not there.

## 2017-08-25 DIAGNOSIS — J449 Chronic obstructive pulmonary disease, unspecified: Secondary | ICD-10-CM | POA: Diagnosis not present

## 2017-08-25 DIAGNOSIS — M797 Fibromyalgia: Secondary | ICD-10-CM | POA: Diagnosis not present

## 2017-09-08 DIAGNOSIS — M797 Fibromyalgia: Secondary | ICD-10-CM | POA: Diagnosis not present

## 2017-09-08 DIAGNOSIS — J449 Chronic obstructive pulmonary disease, unspecified: Secondary | ICD-10-CM | POA: Diagnosis not present

## 2017-09-08 DIAGNOSIS — Z1389 Encounter for screening for other disorder: Secondary | ICD-10-CM | POA: Diagnosis not present

## 2017-09-08 DIAGNOSIS — Z0001 Encounter for general adult medical examination with abnormal findings: Secondary | ICD-10-CM | POA: Diagnosis not present

## 2017-09-08 DIAGNOSIS — Z1331 Encounter for screening for depression: Secondary | ICD-10-CM | POA: Diagnosis not present

## 2017-09-15 DIAGNOSIS — Z1231 Encounter for screening mammogram for malignant neoplasm of breast: Secondary | ICD-10-CM | POA: Diagnosis not present

## 2017-09-15 DIAGNOSIS — R928 Other abnormal and inconclusive findings on diagnostic imaging of breast: Secondary | ICD-10-CM | POA: Diagnosis not present

## 2017-09-30 DIAGNOSIS — R928 Other abnormal and inconclusive findings on diagnostic imaging of breast: Secondary | ICD-10-CM | POA: Diagnosis not present

## 2017-09-30 DIAGNOSIS — N6489 Other specified disorders of breast: Secondary | ICD-10-CM | POA: Diagnosis not present

## 2018-01-07 DIAGNOSIS — M797 Fibromyalgia: Secondary | ICD-10-CM | POA: Diagnosis not present

## 2018-01-07 DIAGNOSIS — E46 Unspecified protein-calorie malnutrition: Secondary | ICD-10-CM | POA: Diagnosis not present

## 2018-01-07 DIAGNOSIS — J449 Chronic obstructive pulmonary disease, unspecified: Secondary | ICD-10-CM | POA: Diagnosis not present

## 2018-02-11 DEATH — deceased
